# Patient Record
Sex: Male | Born: 1935 | Race: White | Hispanic: No | Marital: Married | Smoking: Current every day smoker
Health system: Southern US, Community
[De-identification: ages and names within clinical notes are randomized; demographics above are authoritative.]

## PROBLEM LIST (undated history)

## (undated) DIAGNOSIS — E7800 Pure hypercholesterolemia, unspecified: Secondary | ICD-10-CM

## (undated) DIAGNOSIS — I493 Ventricular premature depolarization: Secondary | ICD-10-CM

## (undated) DIAGNOSIS — N400 Enlarged prostate without lower urinary tract symptoms: Secondary | ICD-10-CM

## (undated) DIAGNOSIS — M545 Low back pain: Secondary | ICD-10-CM

## (undated) DIAGNOSIS — M1990 Unspecified osteoarthritis, unspecified site: Secondary | ICD-10-CM

## (undated) DIAGNOSIS — G8929 Other chronic pain: Secondary | ICD-10-CM

## (undated) DIAGNOSIS — I2119 ST elevation (STEMI) myocardial infarction involving other coronary artery of inferior wall: Secondary | ICD-10-CM

## (undated) DIAGNOSIS — S86819A Strain of other muscle(s) and tendon(s) at lower leg level, unspecified leg, initial encounter: Secondary | ICD-10-CM

## (undated) DIAGNOSIS — I10 Essential (primary) hypertension: Secondary | ICD-10-CM

## (undated) DIAGNOSIS — Z720 Tobacco use: Secondary | ICD-10-CM

## (undated) DIAGNOSIS — L02215 Cutaneous abscess of perineum: Secondary | ICD-10-CM

## (undated) DIAGNOSIS — N529 Male erectile dysfunction, unspecified: Secondary | ICD-10-CM

## (undated) DIAGNOSIS — E119 Type 2 diabetes mellitus without complications: Secondary | ICD-10-CM

## (undated) HISTORY — DX: Cutaneous abscess of perineum: L02.215

## (undated) HISTORY — DX: Benign prostatic hyperplasia without lower urinary tract symptoms: N40.0

## (undated) HISTORY — DX: Essential (primary) hypertension: I10

## (undated) HISTORY — DX: Unspecified osteoarthritis, unspecified site: M19.90

## (undated) HISTORY — DX: ST elevation (STEMI) myocardial infarction involving other coronary artery of inferior wall: I21.19

## (undated) HISTORY — DX: Tobacco use: Z72.0

## (undated) HISTORY — DX: Male erectile dysfunction, unspecified: N52.9

## (undated) HISTORY — DX: Ventricular premature depolarization: I49.3

## (undated) HISTORY — DX: Low back pain: M54.5

## (undated) HISTORY — DX: Strain of other muscle(s) and tendon(s) at lower leg level, unspecified leg, initial encounter: S86.819A

## (undated) HISTORY — PX: JOINT REPLACEMENT: SHX530

## (undated) HISTORY — DX: Pure hypercholesterolemia, unspecified: E78.00

## (undated) HISTORY — DX: Type 2 diabetes mellitus without complications: E11.9

## (undated) HISTORY — DX: Other chronic pain: G89.29

---

## 2005-05-15 ENCOUNTER — Other Ambulatory Visit: Payer: Self-pay

## 2005-05-15 ENCOUNTER — Ambulatory Visit: Payer: Self-pay | Admitting: Otolaryngology

## 2005-05-21 ENCOUNTER — Ambulatory Visit: Payer: Self-pay | Admitting: Otolaryngology

## 2007-01-22 ENCOUNTER — Encounter: Payer: Self-pay | Admitting: Orthopaedic Surgery

## 2007-02-05 ENCOUNTER — Encounter: Payer: Self-pay | Admitting: Orthopaedic Surgery

## 2007-03-08 ENCOUNTER — Encounter: Payer: Self-pay | Admitting: Orthopaedic Surgery

## 2007-08-04 ENCOUNTER — Ambulatory Visit: Payer: Self-pay | Admitting: Orthopedic Surgery

## 2007-08-19 ENCOUNTER — Ambulatory Visit: Payer: Self-pay | Admitting: Orthopedic Surgery

## 2007-08-21 ENCOUNTER — Inpatient Hospital Stay: Payer: Self-pay | Admitting: Orthopedic Surgery

## 2007-09-10 ENCOUNTER — Encounter: Payer: Self-pay | Admitting: Internal Medicine

## 2008-04-13 ENCOUNTER — Ambulatory Visit: Payer: Self-pay | Admitting: Unknown Physician Specialty

## 2009-05-24 ENCOUNTER — Ambulatory Visit: Payer: Self-pay | Admitting: Family Medicine

## 2009-07-24 ENCOUNTER — Ambulatory Visit: Payer: Self-pay | Admitting: Surgery

## 2009-07-26 ENCOUNTER — Ambulatory Visit: Payer: Self-pay | Admitting: Cardiovascular Disease

## 2009-07-31 ENCOUNTER — Ambulatory Visit: Payer: Self-pay | Admitting: Surgery

## 2010-09-24 ENCOUNTER — Encounter: Payer: Self-pay | Admitting: Orthopedic Surgery

## 2010-10-06 ENCOUNTER — Encounter: Payer: Self-pay | Admitting: Orthopedic Surgery

## 2010-11-05 ENCOUNTER — Ambulatory Visit: Payer: Self-pay | Admitting: Orthopedic Surgery

## 2010-11-05 DIAGNOSIS — I10 Essential (primary) hypertension: Secondary | ICD-10-CM

## 2010-11-15 ENCOUNTER — Inpatient Hospital Stay: Payer: Self-pay | Admitting: Orthopedic Surgery

## 2010-11-19 LAB — PATHOLOGY REPORT

## 2011-02-07 ENCOUNTER — Ambulatory Visit: Payer: Self-pay | Admitting: Orthopedic Surgery

## 2011-04-15 ENCOUNTER — Ambulatory Visit: Payer: Self-pay | Admitting: Orthopedic Surgery

## 2011-04-15 LAB — CBC
HGB: 11 g/dL — ABNORMAL LOW (ref 13.0–18.0)
MCH: 26.8 pg (ref 26.0–34.0)
MCHC: 32.6 g/dL (ref 32.0–36.0)
MCV: 82 fL (ref 80–100)
Platelet: 185 10*3/uL (ref 150–440)
RBC: 4.13 10*6/uL — ABNORMAL LOW (ref 4.40–5.90)

## 2011-04-15 LAB — BASIC METABOLIC PANEL
Anion Gap: 14 (ref 7–16)
BUN: 36 mg/dL — ABNORMAL HIGH (ref 7–18)
Chloride: 107 mmol/L (ref 98–107)
Creatinine: 1.1 mg/dL (ref 0.60–1.30)
EGFR (Non-African Amer.): >60
Osmolality: 293 (ref 275–301)
Potassium: 4 mmol/L (ref 3.5–5.1)

## 2011-04-15 LAB — APTT: Activated PTT: 40 secs — ABNORMAL HIGH (ref 23.6–35.9)

## 2011-04-15 LAB — PROTIME-INR: Prothrombin Time: 13.8 secs (ref 11.5–14.7)

## 2011-04-15 LAB — SEDIMENTATION RATE: Erythrocyte Sed Rate: 43 mm/hr — ABNORMAL HIGH (ref 0–20)

## 2011-04-15 LAB — MRSA PCR SCREENING

## 2011-04-23 ENCOUNTER — Inpatient Hospital Stay: Payer: Self-pay | Admitting: Orthopedic Surgery

## 2011-04-24 LAB — BASIC METABOLIC PANEL
BUN: 14 mg/dL (ref 7–18)
Calcium, Total: 7.6 mg/dL — ABNORMAL LOW (ref 8.5–10.1)
Chloride: 112 mmol/L — ABNORMAL HIGH (ref 98–107)
EGFR (African American): >60
EGFR (Non-African Amer.): >60
Osmolality: 280 (ref 275–301)
Potassium: 4.1 mmol/L (ref 3.5–5.1)
Sodium: 139 mmol/L (ref 136–145)

## 2011-04-24 LAB — PLATELET COUNT: Platelet: 255 10*3/uL (ref 150–440)

## 2011-04-24 LAB — HEMOGLOBIN: HGB: 8.3 g/dL — ABNORMAL LOW (ref 13.0–18.0)

## 2011-04-25 LAB — HEMOGLOBIN: HGB: 7.6 g/dL — ABNORMAL LOW (ref 13.0–18.0)

## 2011-04-26 LAB — HEMOGLOBIN: HGB: 8.3 g/dL — ABNORMAL LOW (ref 13.0–18.0)

## 2011-07-08 DIAGNOSIS — E7800 Pure hypercholesterolemia, unspecified: Secondary | ICD-10-CM

## 2011-07-08 HISTORY — DX: Pure hypercholesterolemia, unspecified: E78.00

## 2011-08-12 DIAGNOSIS — N400 Enlarged prostate without lower urinary tract symptoms: Secondary | ICD-10-CM | POA: Insufficient documentation

## 2011-08-12 DIAGNOSIS — M1990 Unspecified osteoarthritis, unspecified site: Secondary | ICD-10-CM

## 2011-08-12 HISTORY — DX: Benign prostatic hyperplasia without lower urinary tract symptoms: N40.0

## 2011-08-12 HISTORY — DX: Unspecified osteoarthritis, unspecified site: M19.90

## 2011-10-08 ENCOUNTER — Ambulatory Visit: Payer: Self-pay | Admitting: Orthopedic Surgery

## 2011-10-23 ENCOUNTER — Ambulatory Visit: Payer: Self-pay | Admitting: Orthopedic Surgery

## 2011-10-23 LAB — BASIC METABOLIC PANEL
BUN: 14 mg/dL (ref 7–18)
Calcium, Total: 8.6 mg/dL (ref 8.5–10.1)
Co2: 24 mmol/L (ref 21–32)
Creatinine: 0.86 mg/dL (ref 0.60–1.30)
Glucose: 104 mg/dL — ABNORMAL HIGH (ref 65–99)
Potassium: 4.2 mmol/L (ref 3.5–5.1)

## 2011-10-23 LAB — APTT: Activated PTT: 36 secs — ABNORMAL HIGH (ref 23.6–35.9)

## 2011-10-23 LAB — CBC
HCT: 38.4 % — ABNORMAL LOW (ref 40.0–52.0)
HGB: 12.5 g/dL — ABNORMAL LOW (ref 13.0–18.0)
MCH: 26.2 pg (ref 26.0–34.0)
MCHC: 32.6 g/dL (ref 32.0–36.0)

## 2011-10-23 LAB — SEDIMENTATION RATE: Erythrocyte Sed Rate: 3 mm/hr (ref 0–20)

## 2011-10-23 LAB — PROTIME-INR: INR: 1

## 2011-10-31 ENCOUNTER — Inpatient Hospital Stay: Payer: Self-pay | Admitting: Orthopedic Surgery

## 2011-10-31 LAB — URINALYSIS, COMPLETE
Bacteria: NONE SEEN
Bilirubin,UR: NEGATIVE
Blood: NEGATIVE
Ketone: NEGATIVE
Nitrite: NEGATIVE
Specific Gravity: 1.005 (ref 1.003–1.030)
Squamous Epithelial: NONE SEEN
WBC UR: 1 /HPF (ref 0–5)

## 2011-11-01 LAB — BASIC METABOLIC PANEL
Anion Gap: 10 (ref 7–16)
BUN: 12 mg/dL (ref 7–18)
Chloride: 112 mmol/L — ABNORMAL HIGH (ref 98–107)
Co2: 23 mmol/L (ref 21–32)
Creatinine: 0.96 mg/dL (ref 0.60–1.30)
EGFR (African American): >60
EGFR (Non-African Amer.): >60
Glucose: 136 mg/dL — ABNORMAL HIGH (ref 65–99)
Osmolality: 291 (ref 275–301)

## 2011-11-01 LAB — HEMOGLOBIN: HGB: 11 g/dL — ABNORMAL LOW (ref 13.0–18.0)

## 2011-11-01 LAB — PLATELET COUNT: Platelet: 168 10*3/uL (ref 150–440)

## 2011-11-02 LAB — HEMOGLOBIN: HGB: 10.4 g/dL — ABNORMAL LOW (ref 13.0–18.0)

## 2011-12-10 ENCOUNTER — Encounter: Payer: Self-pay | Admitting: Sports Medicine

## 2011-12-10 ENCOUNTER — Ambulatory Visit (INDEPENDENT_AMBULATORY_CARE_PROVIDER_SITE_OTHER): Payer: PRIVATE HEALTH INSURANCE | Admitting: Sports Medicine

## 2011-12-10 VITALS — BP 132/75 | HR 82 | Ht 67.0 in | Wt 145.0 lb

## 2011-12-10 DIAGNOSIS — M25461 Effusion, right knee: Secondary | ICD-10-CM | POA: Insufficient documentation

## 2011-12-10 DIAGNOSIS — S86819A Strain of other muscle(s) and tendon(s) at lower leg level, unspecified leg, initial encounter: Secondary | ICD-10-CM

## 2011-12-10 DIAGNOSIS — M25469 Effusion, unspecified knee: Secondary | ICD-10-CM

## 2011-12-10 DIAGNOSIS — S838X9A Sprain of other specified parts of unspecified knee, initial encounter: Secondary | ICD-10-CM

## 2011-12-10 HISTORY — DX: Strain of other muscle(s) and tendon(s) at lower leg level, unspecified leg, initial encounter: S86.819A

## 2011-12-10 NOTE — Progress Notes (Signed)
Subjective:     Patient ID: Paul Hamilton, male   DOB: 12-08-1933, 78 y.o.   MRN: [id number]  CC: R knee swelling and numbness following fall PCP: Devona Konig, PA-C/ orthopedist Dr Menz/  Referred courtesy  HPI 78 yo M with history of R knee replacement in 2010, R hip replacement in march of 2013 with recent revision in October in 2013 presents with R knee weakness resulting in a fall while walking to the bathroom at home. The fall occurred about 2 weeks post-op. He denies associated dizziness, chest pain, SOB. He reports that his R knee has been weak since the revised R hip replacement. After the fall he noticed superior-medial knee swelling. He denies significant pain. He admits to numbness over the site. He admits to decreased knee extension and no strength unless standing with knee straight  Now cannot voluntarily extend Rt knee  Review of Systems As per HPI     Objective:   Physical Exam BP 132/75  Pulse 82  Ht 5\' 7"  (1.702 m)  Wt 145 lb (65.772 kg)  BMI 22.71 kg/m2 General appearance: alert, cooperative, appears stated age and no distress Head: Normocephalic, without obvious abnormality, atraumatic Lungs: normal work of breathing   L Knee: Normal to inspection with no erythema or effusion or obvious bony abnormalities. Palpation normal with no warmth or joint line tenderness or patellar tenderness or condyle tenderness. ROM normal in flexion and extension and lower leg rotation. Ligaments with solid consistent endpoints including ACL, PCL, LCL, MCL. Non painful patellar compression. Patellar and quadriceps tendons unremarkable. Hamstring and quadriceps strength is normal.  R Knee:  superior medial knee swelling with no erythema. No obvious bony abnormalities. Healed longitudinal scar.  Palpation with no warmth, mild medial  joint line tenderness. No patellar tenderness or condyle tenderness. There is a gap just above patella Patellar tendon is softer with poor  tension There is motion of TKR on the femoral component No abnormal motion of TKR on tibial sides   Ultrasound: L knee: intact quadriceps tendon and patellar tendon No effusion  R knee: intact quadriceps tendon with hypoechoic change along edges/ underlying effusion suprapatellar pouch is milc  superiorly displaced metallic fragment vs bone shaft of distal femur Unsure if shift in TKR components   incomplete tear in patellar tendon with associated hypoechoic change and thickening compared to Lt    Assessment and Plan:

## 2011-12-10 NOTE — Assessment & Plan Note (Signed)
This is difficult to assess Unclear whether he can functionally tighten this tendon May need exploration pending status of orthopedic evaluation  Send reports to Dr Rosita Kea and discuss with him
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
## 2011-12-17 ENCOUNTER — Ambulatory Visit: Payer: Self-pay | Admitting: Orthopedic Surgery

## 2011-12-30 ENCOUNTER — Ambulatory Visit: Payer: Self-pay | Admitting: Orthopedic Surgery

## 2012-05-25 ENCOUNTER — Ambulatory Visit: Payer: Self-pay | Admitting: Family Medicine

## 2012-05-31 ENCOUNTER — Observation Stay: Payer: Self-pay

## 2012-05-31 LAB — COMPREHENSIVE METABOLIC PANEL
Albumin: 3.7 g/dL (ref 3.4–5.0)
Alkaline Phosphatase: 82 U/L (ref 50–136)
Anion Gap: 5 — ABNORMAL LOW (ref 7–16)
BUN: 22 mg/dL — ABNORMAL HIGH (ref 7–18)
Bilirubin,Total: 0.5 mg/dL (ref 0.2–1.0)
Co2: 27 mmol/L (ref 21–32)
Creatinine: 1.03 mg/dL (ref 0.60–1.30)
EGFR (African American): >60
EGFR (Non-African Amer.): >60
Glucose: 108 mg/dL — ABNORMAL HIGH (ref 65–99)
SGOT(AST): 16 U/L (ref 15–37)
SGPT (ALT): 17 U/L (ref 12–78)
Total Protein: 7.2 g/dL (ref 6.4–8.2)

## 2012-05-31 LAB — CK TOTAL AND CKMB (NOT AT ARMC): CK-MB: 3.1 ng/mL (ref 0.5–3.6)

## 2012-05-31 LAB — CBC
HCT: 41.4 % (ref 40.0–52.0)
HGB: 13.3 g/dL (ref 13.0–18.0)
MCH: 26 pg (ref 26.0–34.0)
MCHC: 32.2 g/dL (ref 32.0–36.0)
MCV: 81 fL (ref 80–100)
Platelet: 196 10*3/uL (ref 150–440)
RDW: 16.6 % — ABNORMAL HIGH (ref 11.5–14.5)
WBC: 14.4 10*3/uL — ABNORMAL HIGH (ref 3.8–10.6)

## 2012-05-31 LAB — TROPONIN I: Troponin-I: <0.02 ng/mL

## 2012-06-01 LAB — BASIC METABOLIC PANEL
BUN: 24 mg/dL — ABNORMAL HIGH (ref 7–18)
Chloride: 107 mmol/L (ref 98–107)
Co2: 27 mmol/L (ref 21–32)
Creatinine: 1.08 mg/dL (ref 0.60–1.30)
Glucose: 138 mg/dL — ABNORMAL HIGH (ref 65–99)
Osmolality: 286 (ref 275–301)
Potassium: 4.7 mmol/L (ref 3.5–5.1)

## 2012-06-01 LAB — CBC WITH DIFFERENTIAL/PLATELET
Basophil %: 0.8 %
HCT: 36.2 % — ABNORMAL LOW (ref 40.0–52.0)
MCH: 26.6 pg (ref 26.0–34.0)
MCHC: 33.2 g/dL (ref 32.0–36.0)
Monocyte #: 2.2 x10 3/mm — ABNORMAL HIGH (ref 0.2–1.0)
Neutrophil #: 10.5 10*3/uL — ABNORMAL HIGH (ref 1.4–6.5)
Neutrophil %: 66.7 %
WBC: 15.8 10*3/uL — ABNORMAL HIGH (ref 3.8–10.6)

## 2012-06-01 LAB — TROPONIN I: Troponin-I: <0.02 ng/mL

## 2012-06-08 ENCOUNTER — Ambulatory Visit: Payer: Self-pay

## 2012-07-20 ENCOUNTER — Ambulatory Visit: Payer: Self-pay

## 2012-10-08 DIAGNOSIS — M25559 Pain in unspecified hip: Secondary | ICD-10-CM | POA: Insufficient documentation

## 2012-10-08 DIAGNOSIS — Z96649 Presence of unspecified artificial hip joint: Secondary | ICD-10-CM | POA: Insufficient documentation

## 2012-10-19 ENCOUNTER — Ambulatory Visit: Payer: Self-pay | Admitting: Orthopedic Surgery

## 2013-03-26 DIAGNOSIS — Z720 Tobacco use: Secondary | ICD-10-CM

## 2013-03-26 DIAGNOSIS — I493 Ventricular premature depolarization: Secondary | ICD-10-CM | POA: Insufficient documentation

## 2013-03-26 HISTORY — DX: Tobacco use: Z72.0

## 2013-03-26 HISTORY — DX: Ventricular premature depolarization: I49.3

## 2013-03-29 DIAGNOSIS — R931 Abnormal findings on diagnostic imaging of heart and coronary circulation: Secondary | ICD-10-CM | POA: Insufficient documentation

## 2013-03-30 ENCOUNTER — Ambulatory Visit: Payer: Self-pay | Admitting: Family Medicine

## 2013-04-24 IMAGING — CR RIGHT HIP - COMPLETE 2+ VIEW
1 series · 2 of 2 positions shown · non-contrast
Comparison: none

REASON FOR EXAM: s/p THA
COMMENTS:   Bedside (portable):Y

[Series 1: ap · 0.17mm/px · 2 of 2 slices shown]
[im 1/2]
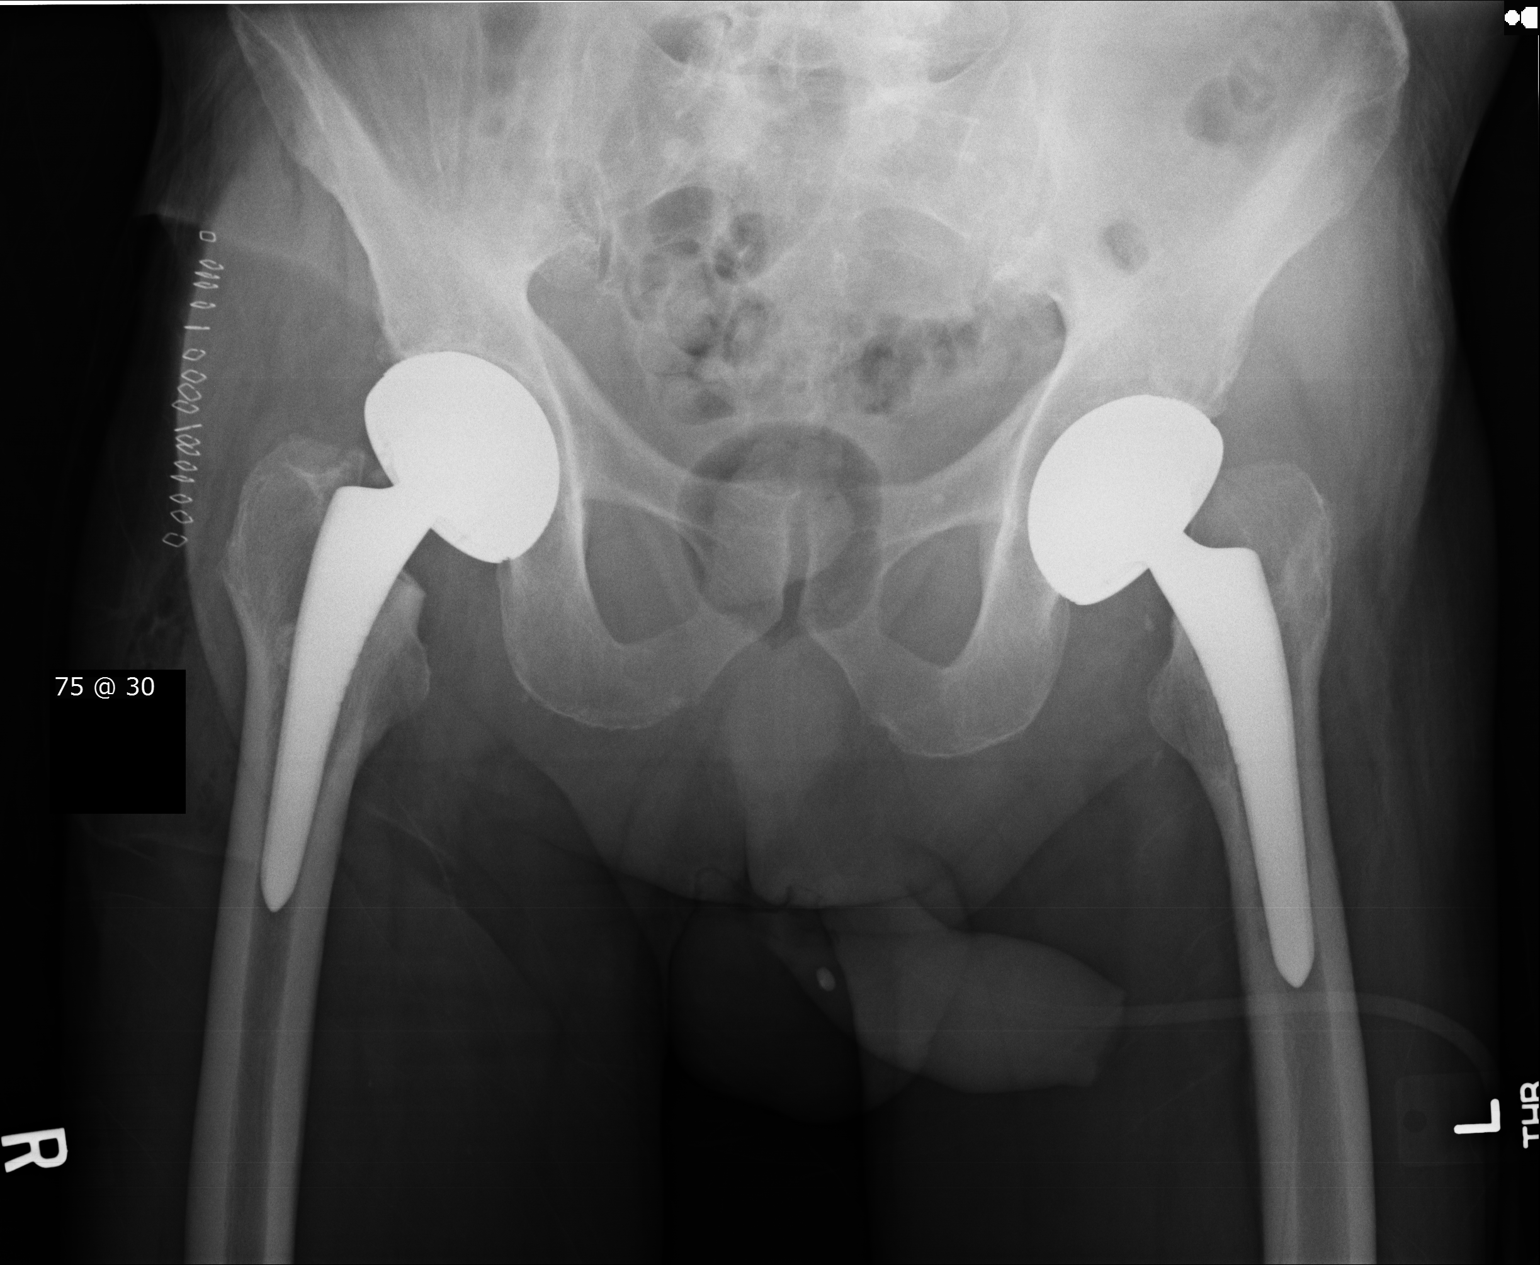
[im 2/2]
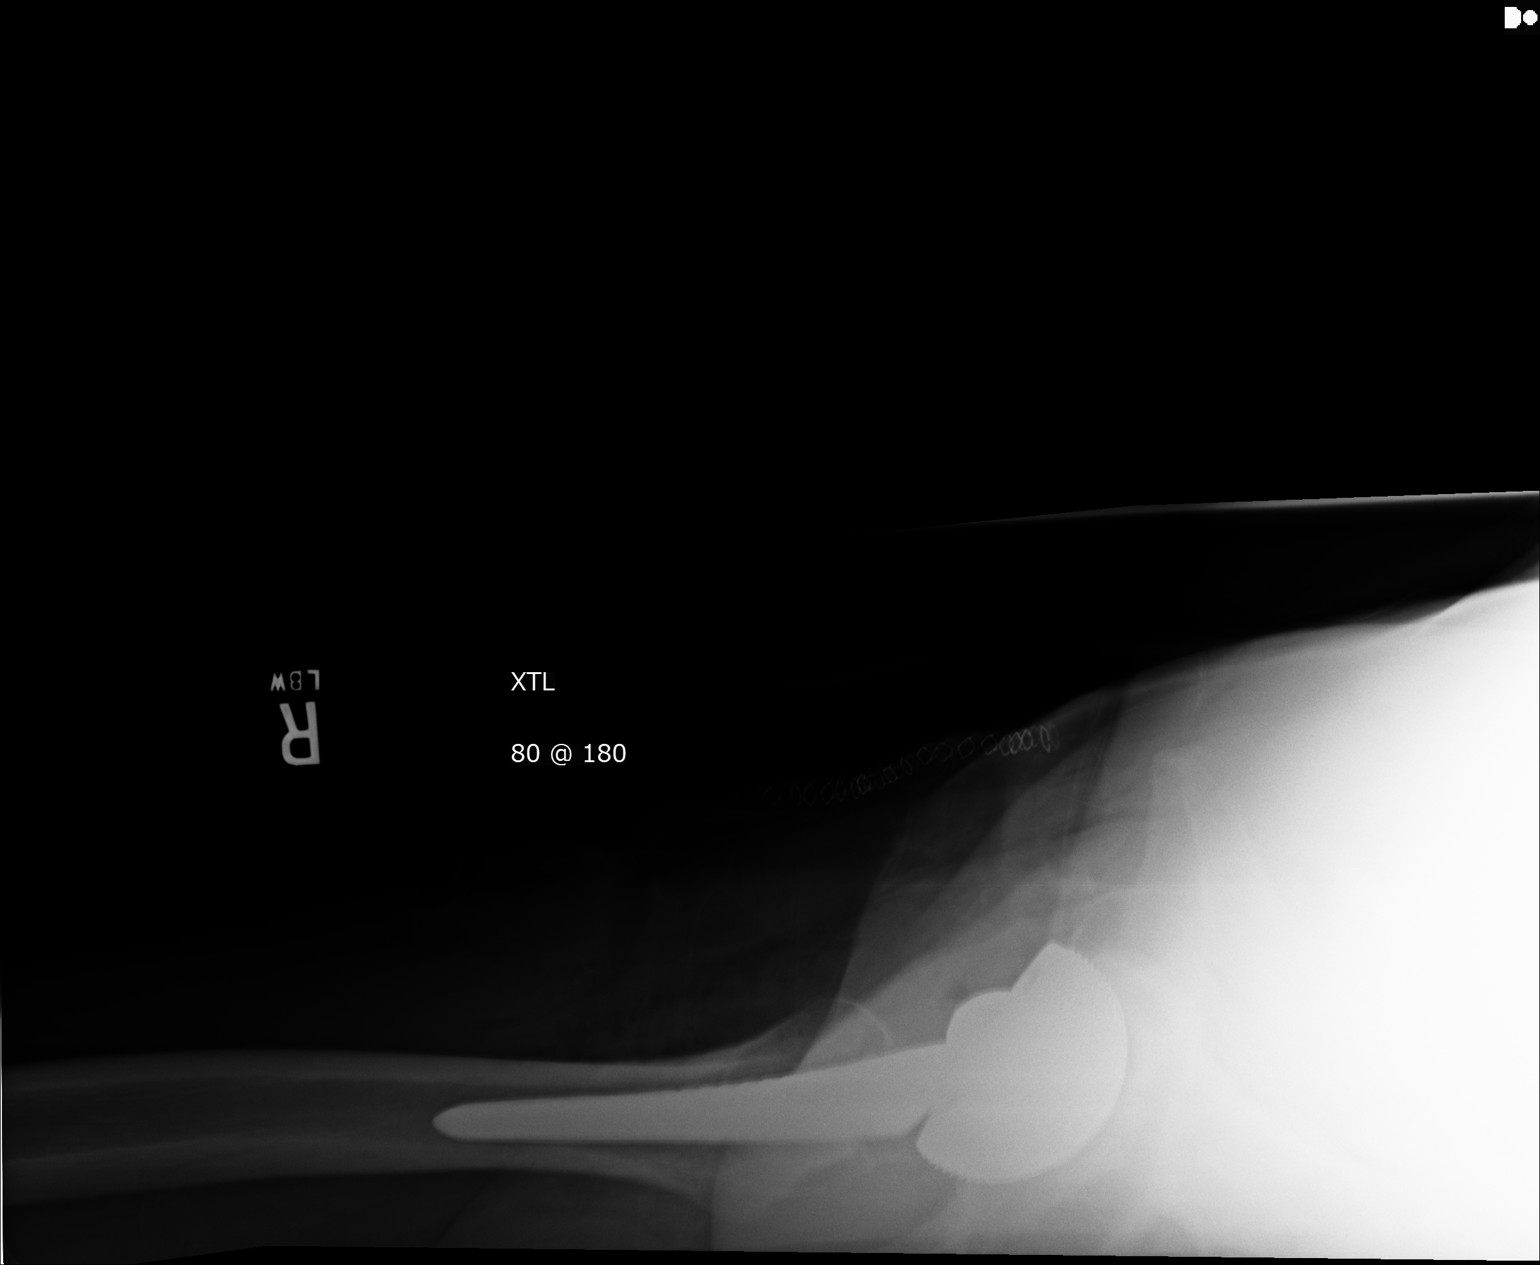

[2 of 2 positions shown; findings below may reference images not displayed]

PROCEDURE:     DXR - DXR HIP RIGHT COMPLETE  - April 23, 2011 [DATE]

RESULT:     AP view of the lower pelvis shows patient status post right hip
arthroplasty. Skin staples are present. No acute bony abnormality is
evident. Crosstable lateral view demonstrates the prosthetic femoral head
located in the acetabulum prosthesis uncovered anteriorly.
IMPRESSION: Postop right hip arthroplasty.

## 2013-04-30 DIAGNOSIS — I2119 ST elevation (STEMI) myocardial infarction involving other coronary artery of inferior wall: Secondary | ICD-10-CM | POA: Insufficient documentation

## 2013-04-30 HISTORY — DX: ST elevation (STEMI) myocardial infarction involving other coronary artery of inferior wall: I21.19

## 2013-06-07 ENCOUNTER — Ambulatory Visit: Payer: Self-pay | Admitting: Hematology and Oncology

## 2013-06-07 LAB — CBC CANCER CENTER
BASOS ABS: 0.1 x10 3/mm (ref 0.0–0.1)
BASOS PCT: 0.5 %
EOS ABS: 0.1 x10 3/mm (ref 0.0–0.7)
EOS PCT: 0.5 %
HCT: 40.7 % (ref 40.0–52.0)
HGB: 12.9 g/dL — ABNORMAL LOW (ref 13.0–18.0)
LYMPHS ABS: 3.1 x10 3/mm (ref 1.0–3.6)
Lymphocyte %: 24.8 %
MCH: 25.8 pg — ABNORMAL LOW (ref 26.0–34.0)
MCHC: 31.6 g/dL — AB (ref 32.0–36.0)
MCV: 82 fL (ref 80–100)
MONO ABS: 1.5 x10 3/mm — AB (ref 0.2–1.0)
Monocyte %: 11.9 %
Neutrophil #: 7.7 x10 3/mm — ABNORMAL HIGH (ref 1.4–6.5)
Neutrophil %: 62.3 %
Platelet: 167 x10 3/mm (ref 150–440)
RBC: 5 10*6/uL (ref 4.40–5.90)
RDW: 16.8 % — ABNORMAL HIGH (ref 11.5–14.5)
WBC: 12.3 x10 3/mm — ABNORMAL HIGH (ref 3.8–10.6)

## 2013-07-05 ENCOUNTER — Ambulatory Visit: Payer: Self-pay | Admitting: Hematology and Oncology

## 2013-08-30 DIAGNOSIS — N529 Male erectile dysfunction, unspecified: Secondary | ICD-10-CM

## 2013-08-30 HISTORY — DX: Male erectile dysfunction, unspecified: N52.9

## 2013-11-01 IMAGING — CR RIGHT HIP - COMPLETE 2+ VIEW
1 series · 2 of 2 positions shown · non-contrast
Comparison: none

REASON FOR EXAM: s/p THA
COMMENTS:   Bedside (portable):Y

[Series 1: ap · 0.17mm/px · 2 of 2 slices shown]
[im 1/2]
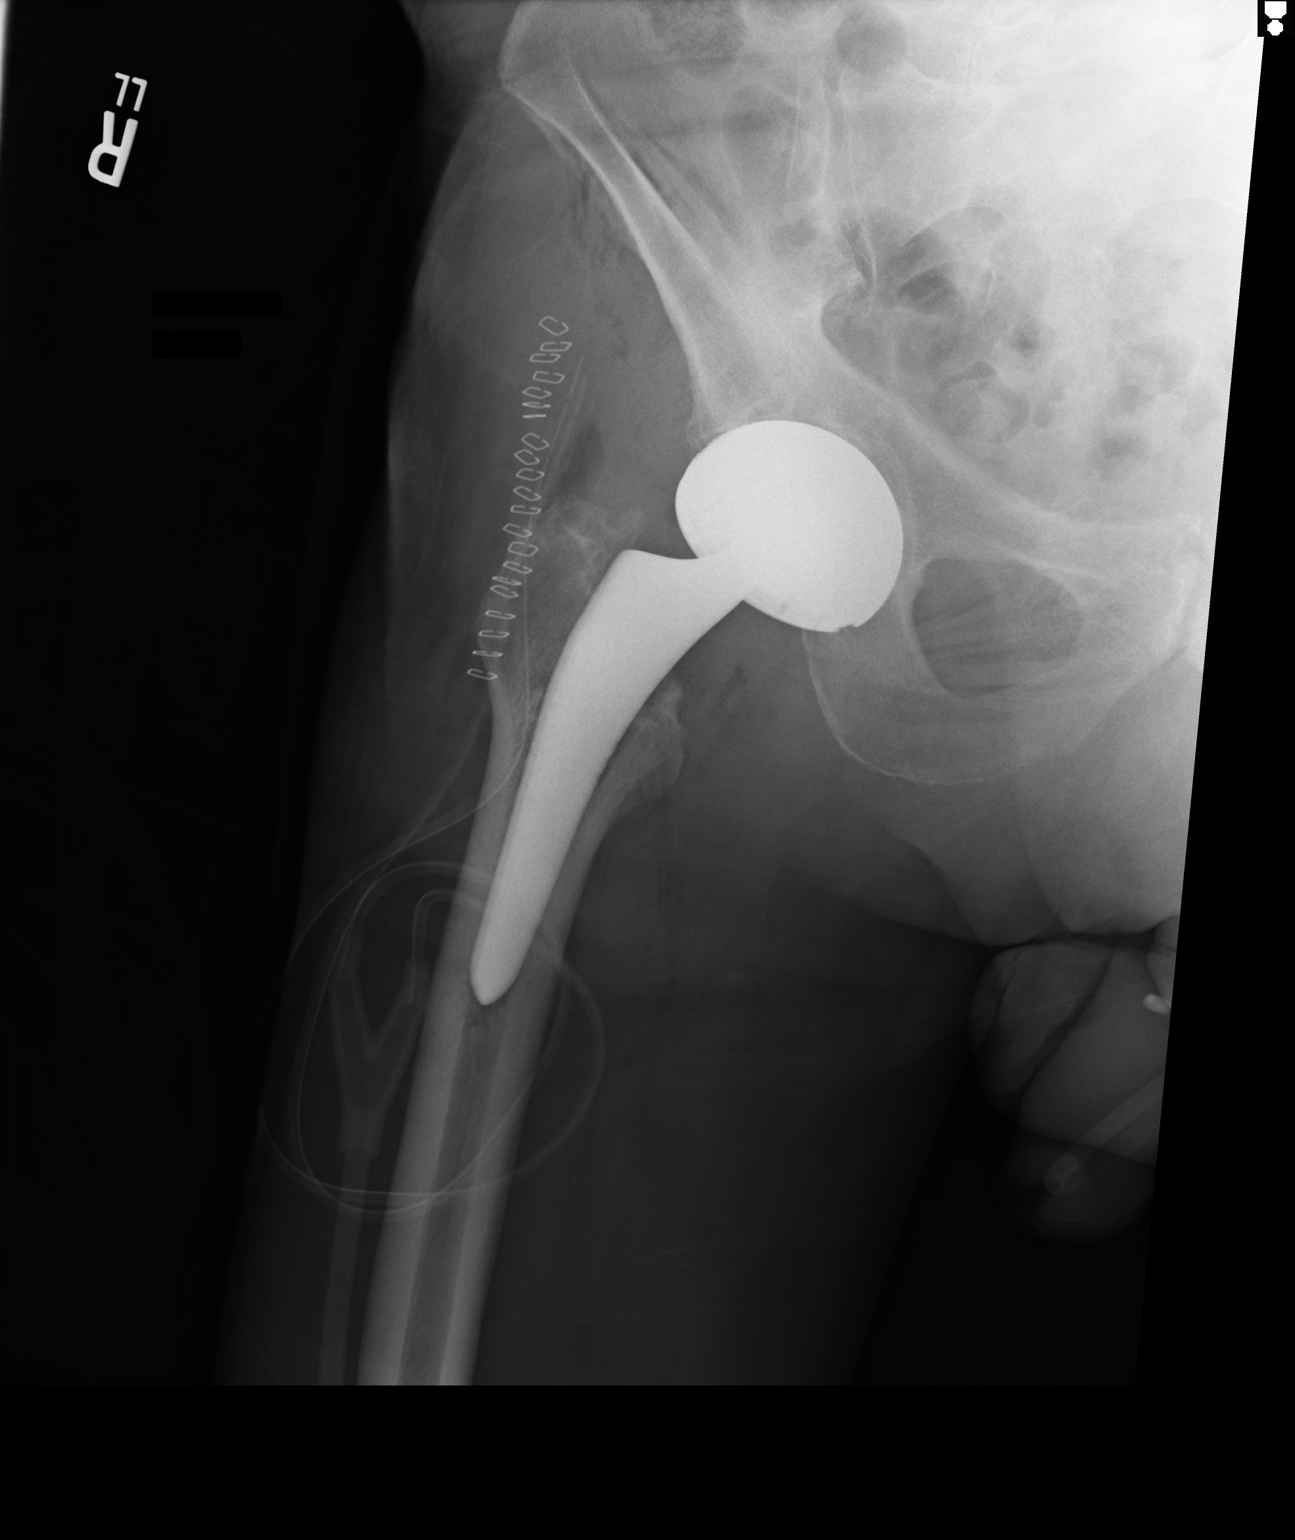
[im 2/2]
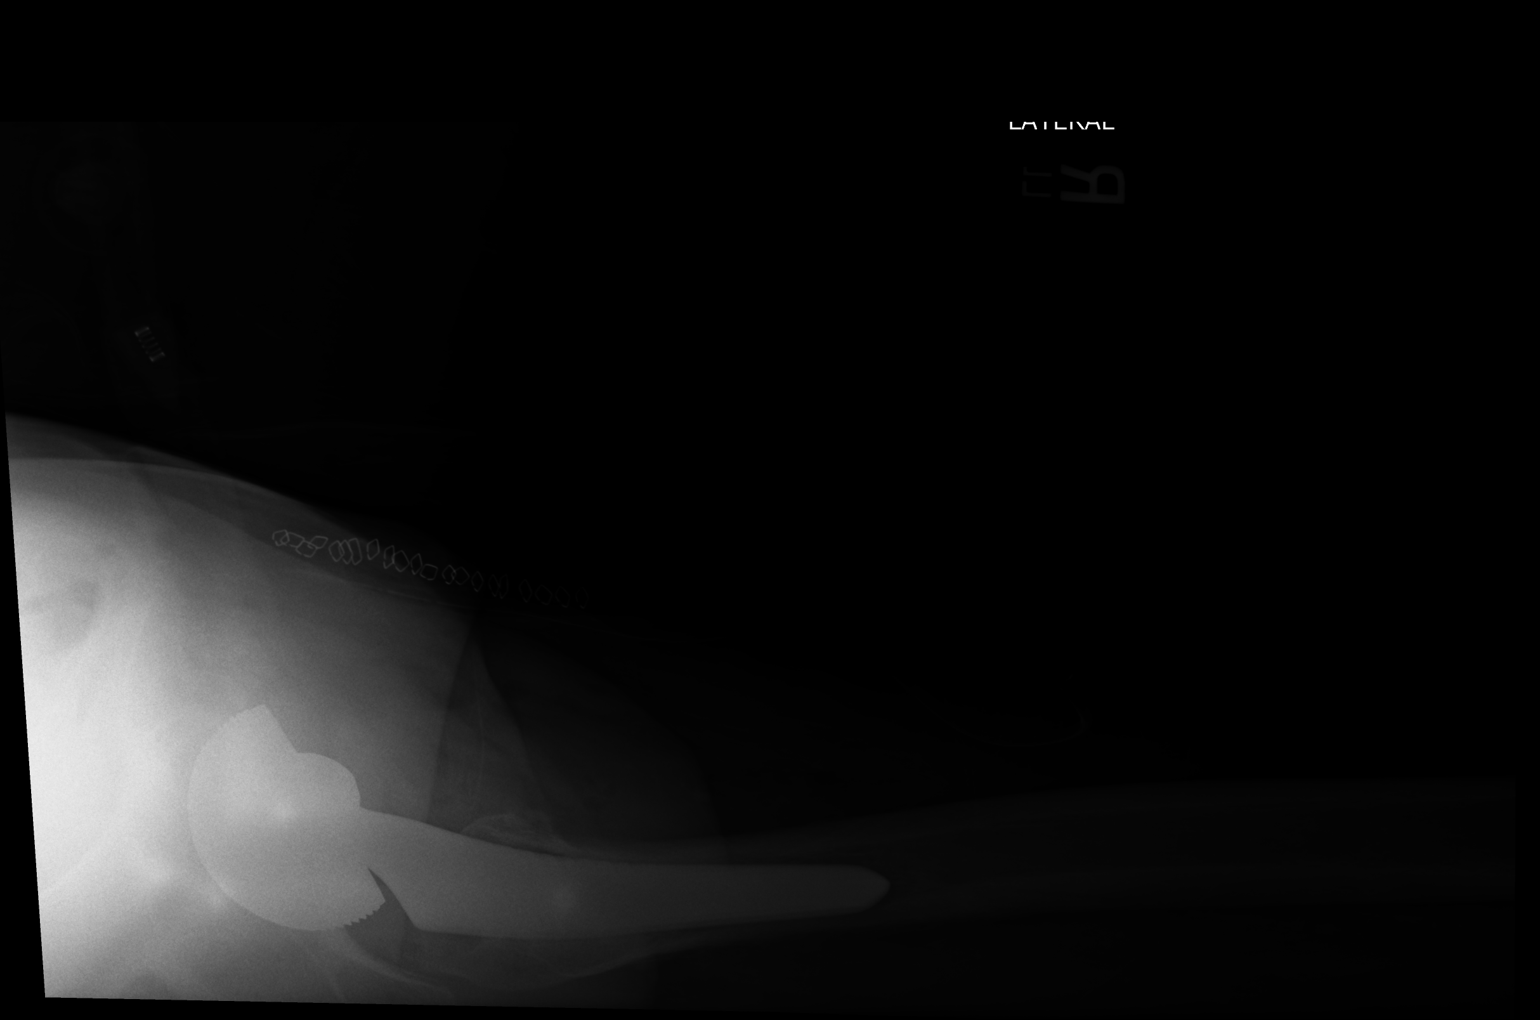

[2 of 2 positions shown; findings below may reference images not displayed]

PROCEDURE:     DXR - DXR HIP RIGHT COMPLETE  - October 31, 2011 [DATE]

RESULT:     AP and lateral portable views of the right hip reveal the
patient to have undergone total joint prosthesis placement. Radiographic
positioning of the prosthetic components is good. Surgical drain lines and
skin staples are present.
IMPRESSION: The patient has undergone right lower right total hip joint
prosthesis placement. Further interpretation is deferred to Dr. Databex.

[REDACTED]

## 2014-05-24 NOTE — Discharge Summary (Signed)
PATIENT NAME:  Paul Hamilton, Paul Hamilton MR#:  [id number] DATE OF BIRTH:  1933/04/27  DATE OF ADMISSION:  10/31/2011 DATE OF DISCHARGE:  11/03/2011  ADMITTING DIAGNOSIS: Loose right femoral component, total hip.  POSTOPERATIVE DIAGNOSIS: Loose right femoral component, total hip.   PROCEDURE: Revision of femoral component, right total hip.   ANESTHESIA: Spinal.   SURGEON: [name], MD   ASSISTANT: Cranston Neighbor, PA-C, April Berndt, NP    ESTIMATED BLOOD LOSS: 100 mL. Cell Saver was present but not required.   COMPLICATIONS: None.   CONDITION: To recovery room stable.   HISTORY: The patient is a 81 year old who initially had successful right hip total replacement. Over the past several months he has had increasing pain in the thigh, pain that was not present previously. 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 He comes in today to discuss treatment options.   PHYSICAL EXAMINATION: LUNGS: Clear to auscultation. HEART: Regular rate and rhythm. HEENT: He has partial upper and lower dentures. NECK: Neck range of motion is good. EXTREMITIES: He has pain with internal and external rotation in the mid thigh. He has a well healed scar anterior scar. Distally he is neurovascularly intact and sensation intact.  I reviewed bone scan with him.   HOSPITAL COURSE: The patient was admitted to the hospital on 10/31/2011. He had surgery that same day and was brought to the orthopedic floor from the PAC-U in stable condition. The patient's vital signs and lab work remained stable and he progressed well with physical therapy and on 11/03/2011 the patient was stable and ready for discharge to home with home PT.   DISCHARGE INSTRUCTIONS:  1. The patient may gradually increase weightbearing on the affected extremity.  2. He may resume a regular diet as tolerated.   MEDICATIONS:   1. Aspirin 81 mg once a day. 2. Ambien 10 mg 1 tablet at night as needed for sleep. 3. Resume typical home pain medications. 4. Tylenol 650 to 1000 mg every six hours as needed for pain. 5. Oxycodone 5 to 10 mg every four hours as needed for pain.   WOUND CARE:  1. Do not get the dressing or bandage wet or dirty.  2. Call [facility] if the dressing gets water under it.  3. Leave the dressing.  4. Remove staples and apply benzoin and Steri-Strips in two weeks.   SYMPTOMS TO REPORT: Call [facility] if any of the following occur:  1. Bright red bleeding from the incision wound, fever above 101.5 degrees, redness, swelling, or drainage at the incision.  2. Call [facility] if you experience increased leg pain, numbness, or weakness in your legs, or bowel or bladder symptoms.   REFERRALS: Home physical therapy has been arranged for continuation of rehab program. Please call [facility] if a therapist has not contacted you within 48 hours of your return home. Call [facility] for follow-up.   DISCHARGE MEDICATIONS:  1. Metformin 500 mg oral tablet 1 tablet orally once a day in the morning.  2. Pravastatin 40 mg oral tablet 1 tablet once a day in the morning.  3. Ibuprofen 200 mg oral tablet 4 tabs orally once a day in the morning and then every six hours as needed.    4. Losartan 100 mg oral tablet 1 tablet orally once a day in the morning.  ____________________________ Evon Slackhomas C. Hazleigh Mccleave, PA-C tcg:drc D: 11/18/2011 17:03:42 ET T: 11/19/2011 08:28:20 ET JOB#: [id number]  cc: Evon Slackhomas C. Kinneth Fujiwara, PA-C, <Dictator> Evon SlackHOMAS C Maythe Deramo GeorgiaPA ELECTRONICALLY SIGNED 11/19/2011 12:39

## 2014-05-24 NOTE — Op Note (Signed)
PATIENT NAME:  Paul Hamilton, Paul Hamilton MR#:  [id number] DATE OF BIRTH:  December 18, 1933  DATE OF PROCEDURE:  10/31/2011  PREOPERATIVE DIAGNOSIS: Loose right femoral component total hip.   POSTOPERATIVE DIAGNOSIS: Loose right femoral component total hip.   PROCEDURE: Revision femoral component right total hip.  ANESTHESIA: Spinal.   SURGEON: Leitha SchullerMichael J. Rhyann Berton, MD  ASSISTANTS: Cranston Neighborhris Gaines, PA-C, April Berndt, nurse practitioner.   DESCRIPTION OF PROCEDURE: Patient brought to the Operating Room and after adequate anesthesia was obtained patient was placed on the operative table with the left leg in the well legholder, right leg in the Medacta table attachment boot. C-arm was brought in and good visualization could be obtained. The hip was then prepped and draped using the standard method and after appropriate patient identification and timeout procedures were completed, the prior anterior incision was made over the tensor fascia lata muscle. The tensor was then retracted laterally and the rectus was identified and retracted medially. The anterior capsule is then incised and a culture obtained of the joint fluid. It was clear in appearance. No evidence of infection. After opening up the capsule adequately the hip could be dislocated. The bipolar head was removed. On inspection of the stem it was significantly loose. After clearing scar around the edges of the implant the implant was removed without difficulty. Next, sequential broaching carried out. It had been a #1 stem. Starting with a 0 and then going up to try to get rid of scar tissue around the medullary canal sequential broaching carried out. With the #2 there still appeared to be looseness to the stem and so a #3 broach was subsequently inserted and had a very nice fill on x-ray and clinically with no rotatory deformity. The #3 stem was inserted after trialing with different head and neck components and a medium head with a 56 mm dual mobility liner was  assembled. After insertion of the stem to the appropriate level the bipolar head was impacted and the hip was reduced. It appeared very stable and leg lengths appeared to be appropriate. The wound was thoroughly irrigated and then closed with Ethibond anteriorly on the capsule, running heavy quill for the deep fascia with a combination of 30 mL of 0.25% Sensorcaine with epinephrine and 10 mg of morphine along with saline injected in the periarticular tissue. A subcutaneous Hemovac drain was inserted. Subcutaneous tissues closing using 2-0 quill followed by skin staples. Xeroform, 4 x 4's, ABD, and tape applied.   SPECIMEN: Removed femoral components. Picture taken of these and culture.   ESTIMATED BLOOD LOSS: 100 mL. Cell Saver was present, but not required.   COMPLICATIONS: None.   CONDITION: To recovery room stable.   ____________________________ Leitha SchullerMichael J. Aldous Housel, MD mjm:cms D: 10/31/2011 14:43:43 ET T: 10/31/2011 16:56:40 ET JOB#: [id number]  cc: Leitha SchullerMichael J. Audryana Hockenberry, MD, <Dictator>  Leitha SchullerMICHAEL J Timira Bieda MD ELECTRONICALLY SIGNED 10/31/2011 18:02

## 2014-05-24 NOTE — Discharge Summary (Signed)
PATIENT NAME:  Paul Hamilton, Jacobi E MR#:  [id number] DATE OF BIRTH:  05/01/1933  DATE OF ADMISSION:  12/30/2011 DATE OF DISCHARGE:  12/31/2011  ADMITTING DIAGNOSIS: Right quadriceps tendon tear.   DISCHARGE DIAGNOSIS: Right quadriceps tendon tear.   PROCEDURE: Repair of quad tendon.   SURGEON: Kennedy BuckerMichael Menz, MD   ANESTHESIA: Spinal.   ESTIMATED BLOOD LOSS: Minimal.   TOURNIQUET TIME: 20 minutes at 300 mmHg.  OPERATIVE FINDINGS: Central portion degenerated and torn.   HISTORY: The patient is a 81 year old who had a total hip revision on 10/31/2011. Subsequently, he developed a lot of weakness in the thigh. 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 Presumably this is related to his surgery. However, he also had an ultrasound because he seems to have a palpable defect in the quadriceps. [facility] by Dr. Darrick PennaFields. This showed extensive tendinitis within the patellar tendon with a defect anteriorly and with significant degenerative change to the quadriceps.   PHYSICAL EXAMINATION: LUNGS: Clear. HEART: Regular rate and rhythm. HEENT: The patient wears glasses. On exam he has palpable defect of the quadriceps. On exam he has weakness to quad extension. He has palpable defect and tenderness over the patellar tendon.   HOSPITAL COURSE: The patient was admitted to the hospital on 12/30/2011. He had surgery that same day and was brought to the orthopedic floor from the PAC-U in stable condition. The patient was monitored overnight and vital signs remained stable. On 12/31/2011, the patient was stable and ready for discharge home with home physical therapy.  DISCHARGE INSTRUCTIONS: The patient may gradually increase weight-bearing on the affected extremity. He needs to wear an immobilizer on the right knee at all times. He may resume a regular diet as tolerated. Medications consist of aspirin 325 mg once  a day and resume typical home medications. Pain medications will consist of Vicodin 1 to 2 tablets every 4 to 6 hours as needed for pain. Wound care - he is to apply ice to the affected area, do not get the dressing or bandage wet or dirty. Call [facility] if the dressing gets water under it. Leave the dressing on. Symptoms to report - call [facility] if any of the following occur: Bright red bleeding from the incision wound, fever above 101.5 degrees, redness, swelling, or drainage at the incision. Call [facility] if you experience any increased leg pain, numbness or weakness in your legs, bowel or bladder symptoms. He is referred home with home physical therapy. He needs to [facility] if a therapist has not contacted him within 48 hours of his return home. He [facility] in two weeks. He needs to call for this appointment.   DISCHARGE MEDICATIONS:  1. Vicodin 5/325 mg 1 to 2 orally every 4 to 6 hours as needed for pain.  2. Metformin 500 mg oral tablet 1 tablet orally once a day in the morning.  3. Pravastatin 40 mg oral tablet 1 tablet orally once a day in the morning. 4. Losartan 100 mg oral tablet 1 tablet orally once a day in the morning.  ____________________________ Evon Slackhomas C. Grantham Hippert, PA-C tcg:slb D: 12/31/2011 08:20:44 ET T: 12/31/2011 13:46:17 ET JOB#: [id number]  cc: Evon Slackhomas C. Wilfrido Luedke, PA-C, <Dictator> Evon SlackHOMAS C Tyera Hansley GeorgiaPA ELECTRONICALLY SIGNED 01/01/2012 10:07

## 2014-05-24 NOTE — Op Note (Signed)
PATIENT NAME:  Paul Hamilton, Paul Hamilton MR#:  [id number] DATE OF BIRTH:  06-30-1933  DATE OF PROCEDURE:  12/30/2011  PREOPERATIVE DIAGNOSIS: Right quadriceps tear.   POSTOPERATIVE DIAGNOSIS: Right quadriceps tear.   PROCEDURE: Repair quadriceps tear, right leg.   ANESTHESIA: Spinal.   SURGEON: Leitha SchullerMichael J. Awilda Covin, MD  DESCRIPTION OF PROCEDURE: Patient was brought to the Operating Room and after adequate spinal anesthesia was obtained, the right leg was prepped and draped in the usual sterile fashion with a tourniquet applied to the upper thigh. After patient identification and timeout procedures were completed, the prior anterior knee incision was opened and the subcutaneous tissue spread. Inspection of the patellar tendon showed it appeared to be normal. Proximal in the central portion of the quadriceps there is approximately 3 cm area of degredation of the tendon with tear that was nearly complete. This tendinopathy area was debrided with scalpel to get fresh edges. A 5 Ethibond was then woven from superior to the tear of the quadriceps around the patella and then woven through this tear and repair of the tendon. This was then oversewn with an 0 chromic to get additional inflammatory reaction and to get healing. The knee was placed through a range of motion and the repair held at 90 degrees of flexion. The knee was then irrigated and closed with 2-0 quill suture followed by skin staples. Xeroform, 4 x 4's, ABD, Webril, and Ace wrap followed by a knee immobilizer. Patient was sent to recovery in stable condition.   ESTIMATED BLOOD LOSS: Minimal.   COMPLICATIONS: None.   SPECIMEN: None.      TOURNIQUET TIME: 20 minutes at 300 mmHg.  ____________________________ Leitha SchullerMichael J. Waunita Sandstrom, MD mjm:cms D: 12/30/2011 20:01:00 ET T: 12/31/2011 10:07:06 ET JOB#: [id number]  cc: Leitha SchullerMichael J. Tiburcio Linder, MD, <Dictator> Leitha SchullerMICHAEL J Cedra Villalon MD ELECTRONICALLY SIGNED 01/02/2012 10:53
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
## 2014-05-29 NOTE — Op Note (Signed)
PATIENT NAME:  Paul Hamilton, Paul Hamilton MR#:  [id number] DATE OF BIRTH:  1933-05-23  DATE OF PROCEDURE:  04/23/2011  PREOPERATIVE DIAGNOSIS: Right hip osteoarthritis.   POSTOPERATIVE DIAGNOSIS: Right hip osteoarthritis.   PROCEDURE: Right total hip replacement, anterior approach.  SURGEON: Kennedy BuckerMichael Hally Colella, M.D.   ASSISTANT: Thompson GrayerJonathan Prentice, PA-C   ANESTHESIA: Spinal.   DESCRIPTION OF PROCEDURE: The patient was brought to the Operating Room and after adequate spinal anesthesia was obtained, the left leg was placed in a well legholder, the right leg in the traction boot for the Medacta frame attachment to the bed. The C-arm was brought in and adequate visualization was obtained with the image being taken of the hip initially. The hip was then prepped and draped in the usual sterile fashion. An anterior approach was made with the incision centered over the tensor muscle. The incision was carried down to the tensor fascia, which was then incised and the tensor retracted laterally, preserving the sartorius fascia. The rectus sheath was then entered and the rectus retracted medially. A branch of the anterior lateral circumflex vessel was coagulated and the vessel itself was tied off. Next, the anterior capsule was exposed and an anterior capsulotomy carried out. The hip was externally rotated and femoral neck cut carried out and the head removed. There was extensive erosion of the superior aspect of the head. The labrum was debrided and sequential reaming was carried out to 56 mm and a 56-mm trial fit well. A 56-mm cup was then impacted into position and was very stable. Next, the leg was dropped down into extension and sequential hand reaming carried out. With the #1 stem there was a tight fit, and with a small head and 56-mm trial the leg lengths appeared to be appropriate with intraoperative imaging. The final components were then impacted and the hip reduced. Final x-ray in the Intra-Op showed good position of  components. The hip appeared stable. The wound was thoroughly irrigated at this point and the capsule repaired using a heavy Ethibond suture, running quill for the tensor fascia, 2-0 quill subcutaneously, and skin staples. Xeroform, 4 x 4's, ABD, and tape applied. Prior to closure the capsule and muscle were infiltrated with 30 mL of Sensorcaine with epinephrine and 10 mg of morphine to aid in postoperative analgesia.   ESTIMATED BLOOD LOSS: 300 mL.  Cell Saver gave back 125.   URINE OUTPUT: 200.   IMPLANTS: Medacta Versa-Fit cup DM, 56-mm outer diameter, 28-mm inner diameter dual mobility liner with a size S 28 femoral head and a size 1 Amis standard stem.  SPECIMEN:  Femoral head.  COMPLICATIONS: None.   CONDITION: To the recovery room stable.    ____________________________ Leitha SchullerMichael J. Annastasia Haskins, MD mjm:bjt D: 04/23/2011 17:50:00 ET T: 04/24/2011 09:29:38 ET JOB#: [id number]  cc: Leitha SchullerMichael J. Haydan Wedig, MD, <Dictator> Leitha SchullerMICHAEL J Trista Ciocca MD ELECTRONICALLY SIGNED 04/24/2011 17:12

## 2014-05-29 NOTE — Discharge Summary (Signed)
PATIENT NAME:  Paul Hamilton, Paul Hamilton MR#:  [id number] DATE OF BIRTH:  Dec 15, 1933  DATE OF ADMISSION:  04/23/2011 DATE OF DISCHARGE:  04/26/2011  ADMITTING DIAGNOSIS: Status post right total hip replacement for degenerative arthritis.   DISCHARGE DIAGNOSES:  1. Status post right total hip replacement for degenerative arthritis.  2. Acute postoperative blood loss anemia.  Better after one unit packed red blood cell transfusion.  ATTENDING: Kennedy Bucker, MD  PROCEDURES: On 03/19 the patient underwent right total hip arthroplasty by Dr. Rosita Kea with assistant Thompson Grayer, PA-C.   ANESTHESIA: Spinal.   ESTIMATED BLOOD LOSS: 400 mL with 125 mL reinfused through Cell Saver.   OPERATIVE FINDINGS: Severe degenerative joint disease.   SPECIMEN: Femoral head specimen was sent.   IMPLANTS: Medacta, AMIS system.   DRAINS: No drains were placed.   COMPLICATIONS: No complications occurred.   HISTORY: Paul Hamilton is a pleasant 81 year old who has had a prior successful left total hip replacement. He had some back problems and had EMG/nerve conduction study that showed some peripheral neuropathy. It did not appear to be stenosis and surgery is not required for his back. He was having persistent right groin and leg pain. Of note, his strength was intact on nerve testing. He has been having debilitating pain with pain at rest and difficulty walking secondary to his pain.  It was bothering him even prior to his other surgery.   ALLERGIES/ ADVERSE REACTIONS: None.   PAST MEDICAL HISTORY:  1. Diabetes.  2. Hypertension.  3. High cholesterol.  4. Cataracts.  5. Arthritis.   PHYSICAL EXAMINATION: HEART: Regular rate and rhythm. LUNGS: Clear to auscultation. RIGHT HIP: 0 degrees internal rotation, 20 degrees external rotation. Distally he does have palpable pulses. He is able to flex and extend the toes and just has some diminished sensation globally consistent with peripheral neuropathy. X-rays of  the right hip show complete loss of joint space without bone loss. There is some subchondral cyst formation in the head and acetabulum.   HOSPITAL COURSE: The patient underwent the aforementioned procedure by Dr. Rosita Kea on 03/19 without complication and was transferred to the postanesthesia care unit and then the orthopedic floor in stable condition. The first day postoperatively he was started on iron supplement. He did have some postoperative anemia secondary to acute postoperative blood loss. He was treated for deep vein thrombosis prophylaxis with Xarelto and TED hose as well as AV-I boots. He was ambulatory. He was tolerating his diet early on. Hemoglobin was 7.6 on postoperative day two, and he did undergo transfusion of 1 unit of packed red blood cells.  His right hip dressing was changed. His incision was clean and intact with staples and no sign of infection. The patient did complain of a burning pain in his leg and Lyrica had been started. Postoperative day three his hemoglobin was 8.3.  He had  progressed well with therapy and ambulated over 300 feet. He was doing well and ready for discharge. Of note, his Foley catheter had been removed in good time while he was here and he had done well with therapy.   CONDITION AT DISCHARGE: Stable.   DISPOSITION: Home with Home Health physical therapy.   DISCHARGE MEDICATIONS:  Oxycodone 5 to 10 mg every four hours as needed for pain. He would administer to himself Lovenox 40 mg subcutaneous injections once a day. He did have these already at home. After he finished the Lovenox he was to take a baby aspirin per day.  He  was prescribed Lyrica 50 mg twice a day for neuropathic pain.   DISCHARGE INSTRUCTIONS AND FOLLOWUP:  1. He is weight-bearing as tolerated on the surgical leg.  2. He will wear knee-high TED hose during the day.  3. Regular diet with no concentrated sweets or sugar.  4. He may ice his hip but he will leave his dressing on.  5. He will  call our office for any disturbing symptoms.  6. [facility] 04/03 at 9:45 a.m. for staple removal.     ____________________________ Paul MoynahanJonathan R. Damonta Hamilton, [place]PA jrp:bjt D: 05/09/2011 10:09:40 ET T: 05/09/2011 10:27:05 ET JOB#: [id number]  cc: Paul MoynahanJonathan R. Paul Hamilton, [place]PA, <Dictator> Paul MoynahanJONATHAN R Paul Macpherson PA ELECTRONICALLY SIGNED 05/17/2011 8:26
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
## 2015-05-23 DIAGNOSIS — G8929 Other chronic pain: Secondary | ICD-10-CM

## 2015-05-23 DIAGNOSIS — M545 Low back pain: Secondary | ICD-10-CM

## 2015-05-23 HISTORY — DX: Other chronic pain: G89.29

## 2016-02-14 ENCOUNTER — Other Ambulatory Visit: Payer: Self-pay

## 2016-02-14 DIAGNOSIS — E119 Type 2 diabetes mellitus without complications: Secondary | ICD-10-CM | POA: Insufficient documentation

## 2016-02-14 DIAGNOSIS — I10 Essential (primary) hypertension: Secondary | ICD-10-CM | POA: Insufficient documentation

## 2016-02-14 HISTORY — DX: Essential (primary) hypertension: I10

## 2016-02-14 HISTORY — DX: Type 2 diabetes mellitus without complications: E11.9

## 2016-02-15 ENCOUNTER — Encounter: Payer: Self-pay | Admitting: General Surgery

## 2016-02-15 ENCOUNTER — Ambulatory Visit (INDEPENDENT_AMBULATORY_CARE_PROVIDER_SITE_OTHER): Payer: Medicare HMO | Admitting: General Surgery

## 2016-02-15 DIAGNOSIS — L02215 Cutaneous abscess of perineum: Secondary | ICD-10-CM

## 2016-02-15 HISTORY — DX: Cutaneous abscess of perineum: L02.215

## 2016-02-15 NOTE — Patient Instructions (Signed)
Please put the shower head on the area with warm water.  Please place a hot/warm cloth four times a day.  We will see you tomorrow to make sure that the area continues to drain.  Please finish taking your antibiotic.

## 2016-02-15 NOTE — Progress Notes (Signed)
Patient ID: Paul Hamilton, male   DOB: 02/15/1933, 83 y.o.   MRN: [id number]  CC: Abscess to perineum  HPI Paul Hamilton is a 83 y.o. male who presents to clinic today for evaluation of an abscess of left side of his perineum behind the scrotum. Patient states she's had numerous abscess like this before but this wonders isn't getting better. He's had one abscess previously that required surgical drainage. This wasn't present for 8 days and he was started on oral antibiotics earlier this week by his primary care provider. He states that he feels like it wants to drain but has not been draining. He denies any current fevers, chills, nausea, vomiting, chest pain, shortness of breath, diarrhea, constipation.  HPI  Past medical history: Diabetes type 2, hypertension, hyperlipidemia, PVC, previous MI, chronic back pain  Past surgical history: Bilateral hip replacements  Family history: Patient denies any known family history of heart disease, diabetes, cancers.  Social History Social History  Substance Use Topics  . Smoking status: Current Every Day Smoker    Packs/day: 1.00  . Smokeless tobacco: Never Used  . Alcohol use Not on file    No Known Allergies  Current Outpatient Prescriptions  Medication Sig Dispense Refill  . doxycycline (VIBRAMYCIN) 100 MG capsule Take 1 capsule by mouth 2 (two) times daily.    Marland Kitchen. losartan (COZAAR) 50 MG tablet Take 50 mg by mouth daily.    . metFORMIN (GLUCOPHAGE-XR) 500 MG 24 hr tablet Take 500 mg by mouth daily.    . pravastatin (PRAVACHOL) 40 MG tablet Take 40 mg by mouth daily.     No current facility-administered medications for this visit.      Review of Systems A Multi-point review of systems was asked and was negative except for the findings documented in the history of present illness  Physical Exam Blood pressure 114/67, pulse 81, temperature 98 F (36.7 C), temperature source Oral, height 5\' 7"  (1.702 m), weight 67 kg (147 lb 9.6  oz). CONSTITUTIONAL: No acute distress. EYES: Pupils are equal, round, and reactive to light, Sclera are non-icteric. EARS, NOSE, MOUTH AND THROAT: The oropharynx is clear. The oral mucosa is pink and moist. Hearing is intact to voice. LYMPH NODES:  Lymph nodes in the neck are normal. RESPIRATORY:  Lungs are clear. There is normal respiratory effort, with equal breath sounds bilaterally, and without pathologic use of accessory muscles. CARDIOVASCULAR: Heart is regular without murmurs, gallops, or rubs. GI: The abdomen is soft, nontender, and nondistended. There are no palpable masses. There is no hepatosplenomegaly. There are normal bowel sounds in all quadrants. GU: Rectal deferred, however there is a 2 x 2 centimeter palpable area of induration with an eschar present on the most posterior aspect that will drain purulent fluid on palpation.   MUSCULOSKELETAL: Normal muscle strength and tone. No cyanosis or edema.   SKIN: Turgor is good and there are no pathologic skin lesions or ulcers. NEUROLOGIC: Motor and sensation is grossly normal. Cranial nerves are grossly intact. PSYCH:  Oriented to person, place and time. Affect is normal.  Data Reviewed There are no recent labs to review for this encounter I have personally reviewed the patient's imaging, laboratory findings and medical records.    Assessment    Perineal abscess    Plan    83 year old male with a perineal abscess. A gentle probing of the eschar with a hemostat forceps allowed return of purulent fluid. With the area actively draining out discussed the continuance  of the oral antibiotics prescribed by his primary care and the use of heat with showers and warm compresses to encourage drainage. Due to the size and location of this abscess discussed that I wanted him to return to clinic in 24 hours for an additional check and should the area not be markedly improved then he would likely require an incision and drainage at that time.  Patient voiced understanding and will return to clinic tomorrow.     Time spent with the patient was 30 minutes, with more than 50% of the time spent in face-to-face education, counseling and care coordination.     Ricarda Frame, MD FACS General Surgeon 02/15/2016, 2:58 PM

## 2016-02-16 ENCOUNTER — Encounter: Payer: Self-pay | Admitting: Surgery

## 2016-02-16 ENCOUNTER — Ambulatory Visit (INDEPENDENT_AMBULATORY_CARE_PROVIDER_SITE_OTHER): Payer: Medicare HMO | Admitting: Surgery

## 2016-02-16 VITALS — BP 115/66 | HR 91 | Temp 98.2°F | Ht 67.0 in | Wt 149.6 lb

## 2016-02-16 DIAGNOSIS — L02215 Cutaneous abscess of perineum: Secondary | ICD-10-CM
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
## 2016-02-16 NOTE — Patient Instructions (Signed)
We will see you back next Wednesday. See appointment information below.  Continue your antibiotics until they are complete.  Please call if you have any increased pain, redness, foul smelling drainage, or fever >100.5 prior to your next appointment. You will need to speak with a nurse.

## 2016-02-21 ENCOUNTER — Ambulatory Visit: Payer: Self-pay | Admitting: Surgery

## 2016-02-26 ENCOUNTER — Encounter: Payer: Self-pay | Admitting: Surgery

## 2016-02-26 ENCOUNTER — Ambulatory Visit (INDEPENDENT_AMBULATORY_CARE_PROVIDER_SITE_OTHER): Payer: Medicare HMO | Admitting: Surgery

## 2016-02-26 VITALS — BP 159/74 | HR 80 | Temp 97.4°F | Ht 67.0 in | Wt 151.0 lb

## 2016-02-26 DIAGNOSIS — L02215 Cutaneous abscess of perineum: Secondary | ICD-10-CM

## 2016-02-26 NOTE — Progress Notes (Signed)
02/26/2016  HPI: Patient was seen on 1/12 for a right perineal abscess which was spontaneously draining.  Forceps had been used to allow for the abscess to drain better.  No packing used.  He has since completed his antibiotic course.  He reports he still feels some soreness in the area.  Denies any further drainage or worsening pain.  Vital signs: BP (!) 159/74   Pulse 80   Temp 97.4 F (36.3 C) (Oral)   Ht 5\' 7"  (1.702 m)   Wt 68.5 kg (151 lb)   BMI 23.65 kg/m    Physical Exam: Constitutional: No acute distress Skin:  Right perineal wound is healing well, almost complete.  No further cellulitis or significant induration.  No purulent drainage or tenderness to palpation.  Assessment/Plan: 83 yo male with right perineal abscess.  --Abscess healing well without further evidence of infection.  No further procedures or antibiotics needed. --Have reassured patient that the soreness he still feels is normal from the inflammatory response to the abscess.  The hardened area that he feels is the inflammation that will continue to subside. --Patient understands to call office if any worsening pain, redness, swelling, or drainage.  Otherwise he may follow up on an as needed basis.   Howie IllJose Luis Aasir Daigler, MD [facility]

## 2016-02-26 NOTE — Patient Instructions (Signed)
Please call our office if you have any questions or concerns.  

## 2016-03-19 ENCOUNTER — Ambulatory Visit
Admission: RE | Admit: 2016-03-19 | Discharge: 2016-03-19 | Disposition: A | Discharge status: Home / Self Care | Payer: Medicare HMO | Source: Ambulatory Visit | Attending: Medical Oncology | Admitting: Medical Oncology

## 2016-03-19 ENCOUNTER — Other Ambulatory Visit: Payer: Self-pay | Admitting: Medical Oncology

## 2016-03-19 DIAGNOSIS — M25469 Effusion, unspecified knee: Secondary | ICD-10-CM

## 2016-03-19 DIAGNOSIS — M25561 Pain in right knee: Secondary | ICD-10-CM | POA: Diagnosis present

## 2016-03-19 DIAGNOSIS — M25461 Effusion, right knee: Secondary | ICD-10-CM | POA: Insufficient documentation

## 2017-03-05 IMAGING — US US EXTREM LOW VENOUS*R*
1 series · 13 of 24 positions shown · non-contrast
Comparison: None.

CLINICAL DATA: Right leg pain chronically but worse over the last 3
days



[Series 1: us extrem low venous*right* · 0.07mm/px · 13 of 34 slices shown]
[im 1/34]
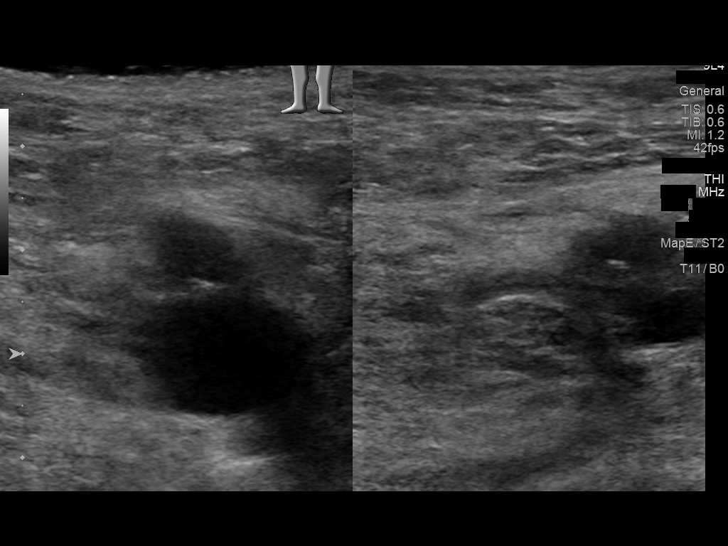
[im 3/34]
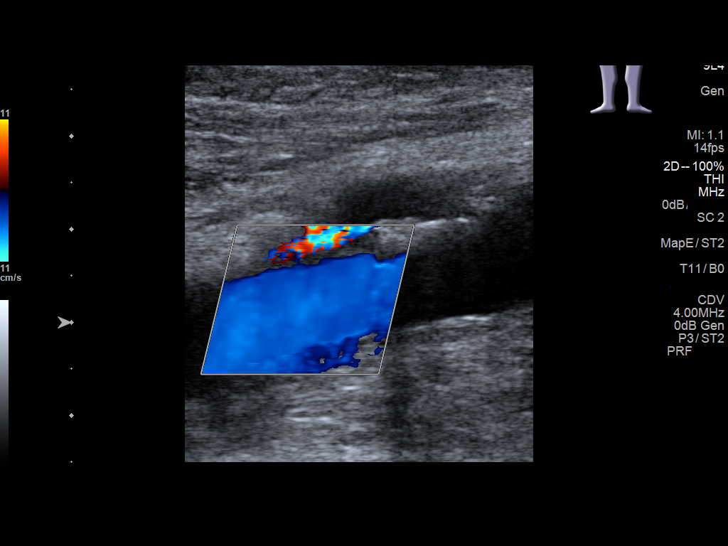
[im 6/34]
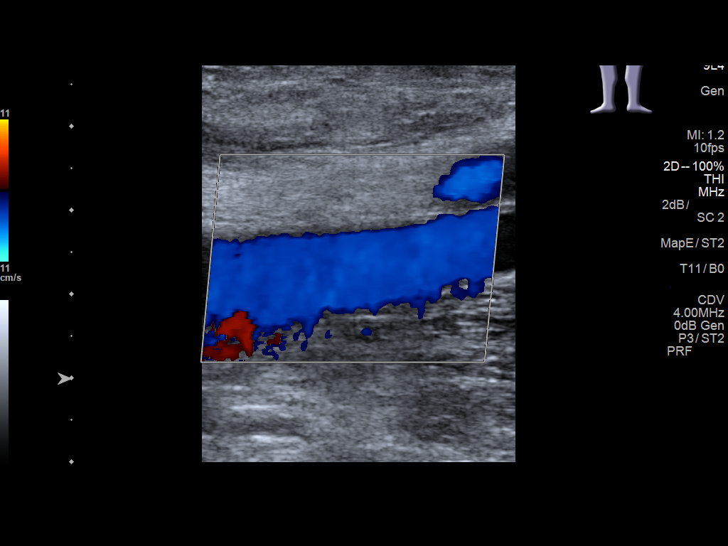
[im 9/34]
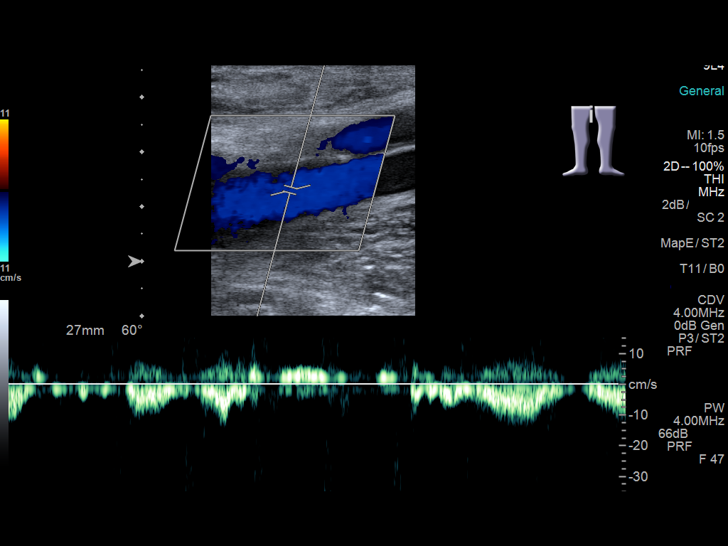
[im 12/34]
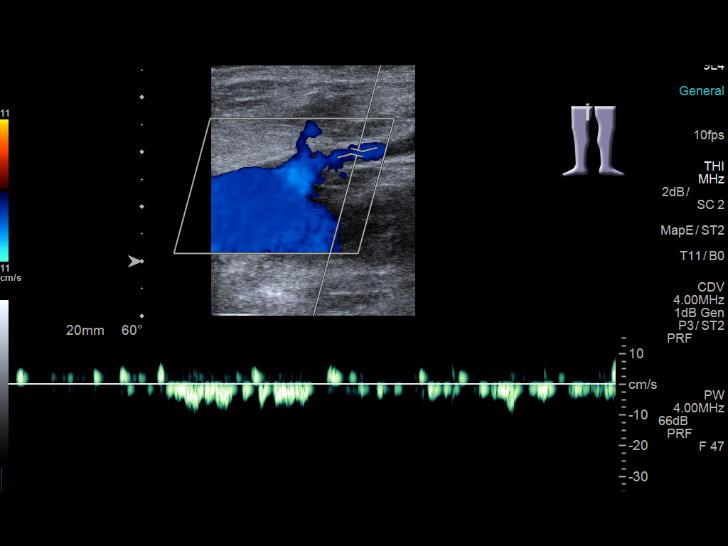
[im 15/34]
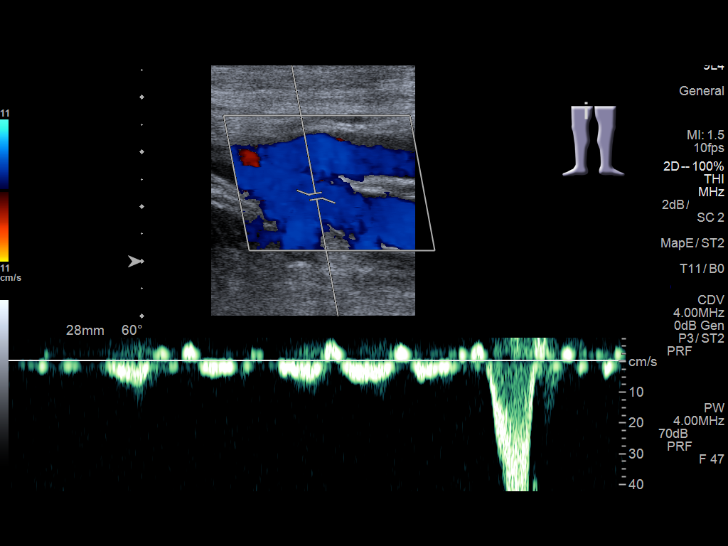
[im 18/34]
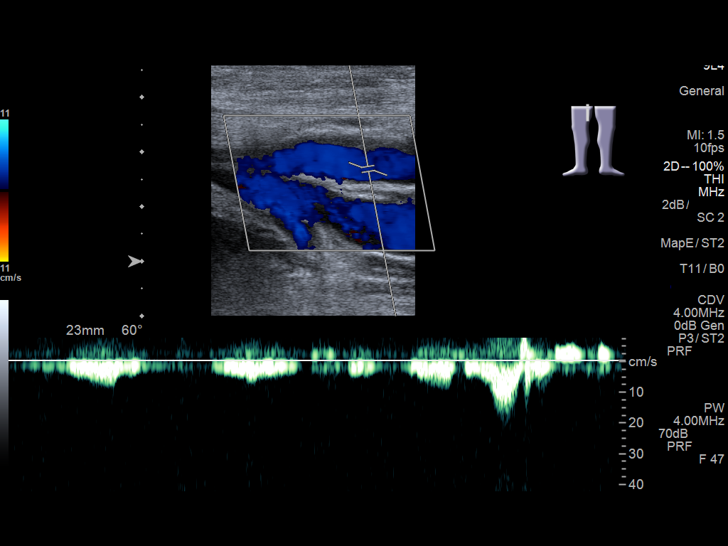
[im 19/34]
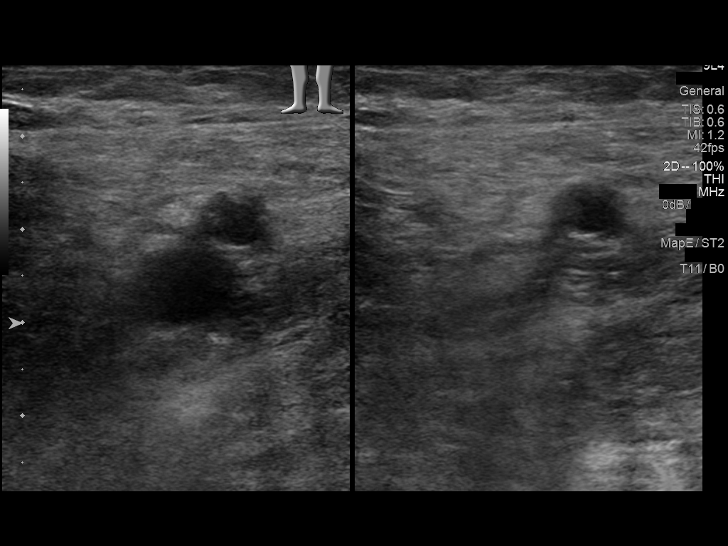
[im 22/34]
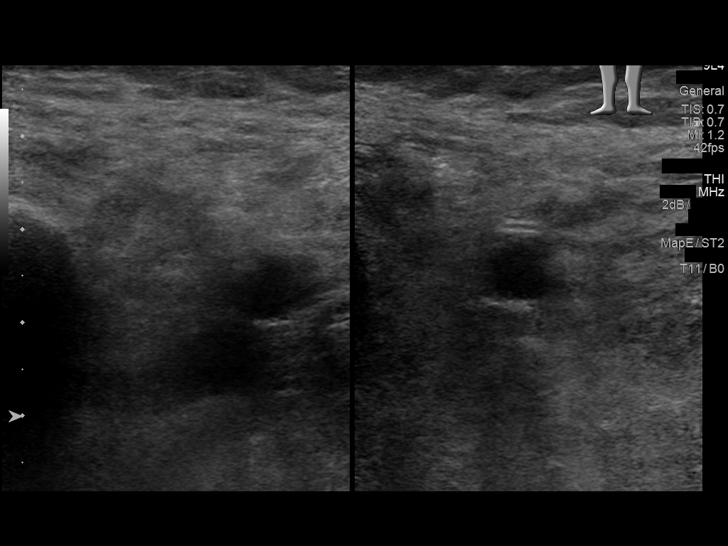
[im 25/34]
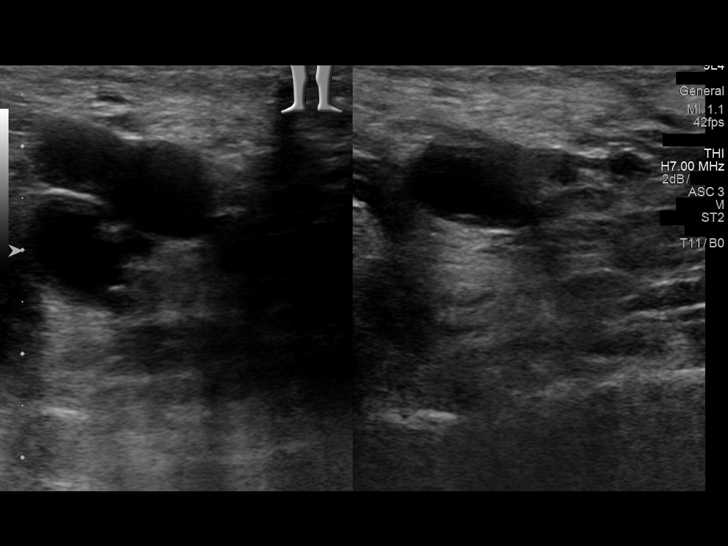
[im 28/34]
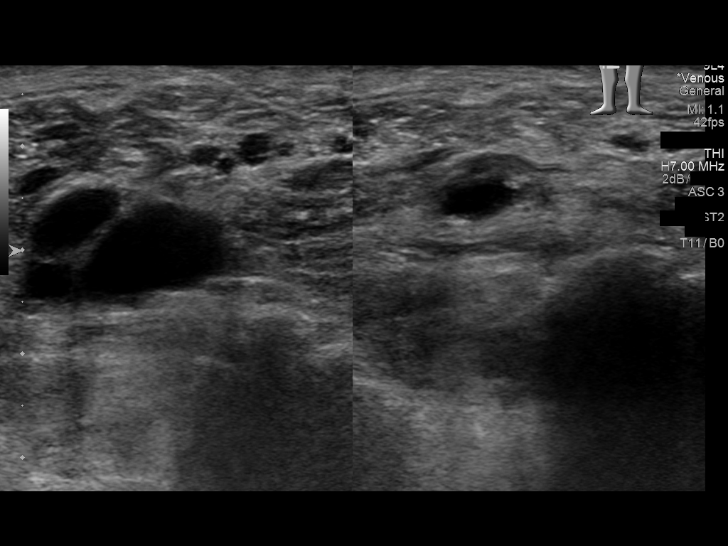
[im 31/34]
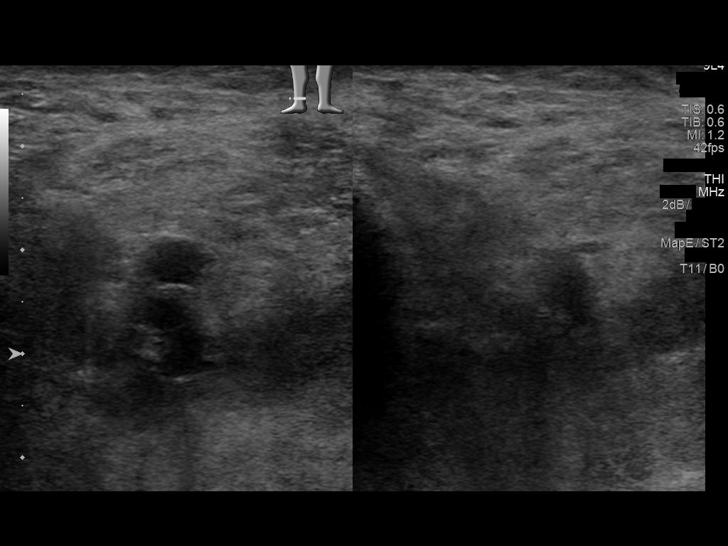
[im 34/34]
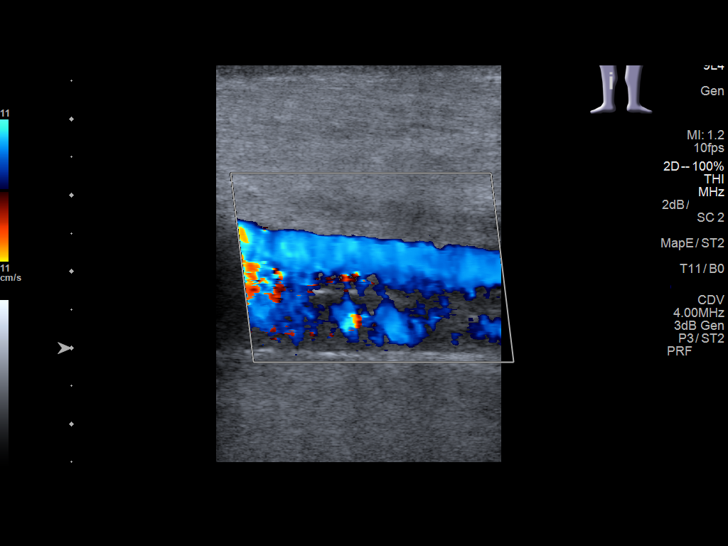

[13 of 24 positions shown; findings below may reference images not displayed]

FINDINGS: Contralateral Common Femoral Vein: Respiratory phasicity is normal
and symmetric with the symptomatic side. No evidence of thrombus.
Normal compressibility.

Common Femoral Vein: No evidence of thrombus. Normal
compressibility, respiratory phasicity and response to augmentation.

Saphenofemoral Junction: No evidence of thrombus. Normal
compressibility and flow on color Doppler imaging.

Profunda Femoral Vein: No evidence of thrombus. Normal
compressibility and flow on color Doppler imaging.

Femoral Vein: No evidence of thrombus. Normal compressibility,
respiratory phasicity and response to augmentation.

Popliteal Vein: No evidence of thrombus. Normal compressibility,
respiratory phasicity and response to augmentation.

Calf Veins: No evidence of thrombus. Normal compressibility and flow
on color Doppler imaging.

Superficial Great Saphenous Vein: No evidence of thrombus. Normal
compressibility and flow on color Doppler imaging.

Venous Reflux:  None.

Other Findings:  None.
IMPRESSION: No evidence of deep venous thrombosis.

## 2017-08-19 ENCOUNTER — Encounter: Payer: Self-pay | Admitting: Internal Medicine

## 2017-08-19 ENCOUNTER — Inpatient Hospital Stay: Payer: Medicare HMO | Attending: Internal Medicine | Admitting: Internal Medicine

## 2017-08-19 DIAGNOSIS — D729 Disorder of white blood cells, unspecified: Secondary | ICD-10-CM | POA: Insufficient documentation

## 2017-08-19 NOTE — Assessment & Plan Note (Addendum)
#   Neutrophilia/mild monocytosis-long-standing/chronic since 2015 [12-14]; with mild anemia hemoglobin on 12.5.  The differential diagnosis includes-primary bone marrow process like chronic myelo proliferative neoplasm [CMML] versus reactive [smoking versus others].   #Given the chronicity of the above problem-I suspect reactive; rather than any primary myeloproliferative neoplasm.   # smoking-discussed  smoking cessation patient states he is "trying to quit"  # Lung cancer screening- pt declined  # Follow-up in 6 months labs CBC/cmp/ldh;  Thank you Ms.Elizabeth White NP for allowing me to participate in the care of your pleasant patient. Please do not hesitate to contact me with questions or concerns in the interim.  # 45 minutes face-to-face with the patient discussing the above plan of care; more than 50% of time spent on prognosis/ natural history; counseling and coordination.  Cc; Doristine MangoElizabeth White.

## 2017-08-19 NOTE — Progress Notes (Signed)
[facility] CONSULT NOTE  Patient Care Team: Titus MouldWhite, Elizabeth Burney, NP as PCP - General (Family Medicine)  CHIEF COMPLAINTS/PURPOSE OF CONSULTATION:  LEUCOCYTOSIS  # 2015- MILD LEUCOCYTOSIS [mild neutrophilia/monocytosis] ~12-14- s/p eval by Dr.Ramiah ? Smoking vs other  # active smoker/ COPD  No history exists.     HISTORY OF PRESENTING ILLNESS:  Paul Hamilton 84 y.o.  male history of chronic smoking ; chronic leukocytosis has been referred to us for further evaluation recommendations.  Patient denies any weight loss but denies any fevers or night sweats.   Patient has chronic shortness of breath which he attributes to his COPD.   Patient states in the past [in 2015] he was evaluated at the cancer center similar problem.  He never had any bone marrow biopsies.  He continues to be asymptomatic otherwise.  Review of Systems  Constitutional: Negative for chills, diaphoresis, fever, malaise/fatigue and weight loss.  HENT: Negative for nosebleeds and sore throat.   Eyes: Negative for double vision.  Respiratory: Positive for cough (Chronic) and shortness of breath (Chronic). Negative for hemoptysis, sputum production and wheezing.   Cardiovascular: Negative for chest pain, palpitations, orthopnea and leg swelling.  Gastrointestinal: Negative for abdominal pain, blood in stool, constipation, diarrhea, heartburn, melena, nausea and vomiting.  Genitourinary: Negative for dysuria, frequency and urgency.  Musculoskeletal: Negative for back pain and joint pain.  Skin: Negative.  Negative for itching and rash.  Neurological: Negative for dizziness, tingling, focal weakness, weakness and headaches.  Endo/Heme/Allergies: Does not bruise/bleed easily.  Psychiatric/Behavioral: Negative for depression. The patient is not nervous/anxious and does not have insomnia.      MEDICAL HISTORY:  Past Medical History:  Diagnosis Date  . Abscess, perineum 02/15/2016  . Benign  prostatic hyperplasia without lower urinary tract symptoms 08/12/2011  . Chronic low back pain 05/23/2015   Overview:  Not a surgical candidate. Scheduled narcotics by provider. Pain contract has been signed.   . Diabetes mellitus type 2, uncomplicated (HCC) 02/14/2016   Overview:  metformin  . ED (erectile dysfunction) 08/30/2013  . Hypercholesterolemia 07/08/2011  . Hypertension 02/14/2016   Overview:  losartan  . Inferior MI (HCC) 04/30/2013  . Osteoarthrosis 08/12/2011  . Patellar tendon rupture 12/10/2011   This appears to be a partial patellar tendon tear or internal disruption   . PVC (premature ventricular contraction) 03/26/2013  . Tobacco abuse 03/26/2013    SURGICAL HISTORY: Past Surgical History:  Procedure Laterality Date  . JOINT REPLACEMENT     Hip    SOCIAL HISTORY: Social History   Socioeconomic History  . Marital status: Married    Spouse name: Not on file  . Number of children: Not on file  . Years of education: Not on file  . Highest education level: Not on file  Occupational History  . Not on file  Social Needs  . Financial resource strain: Not on file  . Food insecurity:    Worry: Not on file    Inability: Not on file  . Transportation needs:    Medical: Not on file    Non-medical: Not on file  Tobacco Use  . Smoking status: Current Every Day Smoker    Packs/day: 1.00  . Smokeless tobacco: Never Used  Substance and Sexual Activity  . Alcohol use: No  . Drug use: No  . Sexual activity: Not on file  Lifestyle  . Physical activity:    Days per week: Not on file    Minutes per session: Not  on file  . Stress: Not on file  Relationships  . Social connections:    Talks on phone: Not on file    Gets together: Not on file    Attends religious service: Not on file    Active member of club or organization: Not on file    Attends meetings of clubs or organizations: Not on file    Relationship status: Not on file  . Intimate partner violence:    Fear of  current or ex partner: Not on file    Emotionally abused: Not on file    Physically abused: Not on file    Forced sexual activity: Not on file  Other Topics Concern  . Not on file  Social History Narrative   Smoking 1ppd [all my life]; no alcohol, in [place]; with wife; auto-body works; retd.     FAMILY HISTORY: Family History  Problem Relation Age of Onset  . Hypertension Father     ALLERGIES:  has No Known Allergies.  MEDICATIONS:  Current Outpatient Medications  Medication Sig Dispense Refill  . hydrochlorothiazide (HYDRODIURIL) 12.5 MG tablet Take 1 tablet by mouth daily.    . metFORMIN (GLUCOPHAGE) 1000 MG tablet Take 1 tablet by mouth daily.    . pravastatin (PRAVACHOL) 40 MG tablet Take 40 mg by mouth daily.    . traMADol (ULTRAM) 50 MG tablet Take 1 tablet by mouth every 6 (six) hours as needed for pain.    Marland Kitchen Umeclidinium-Vilanterol (ANORO ELLIPTA IN) Inhale 1 tablet into the lungs 2 (two) times daily as needed for shortness of breath.    . losartan (COZAAR) 25 MG tablet Take 1 tablet by mouth daily.     No current facility-administered medications for this visit.       Marland Kitchen  PHYSICAL EXAMINATION: ECOG PERFORMANCE STATUS: 0 - Asymptomatic  Vitals:   08/19/17 1050  BP: 116/77  Pulse: 70  Temp: 97.9 F (36.6 C)   Filed Weights   08/19/17 1050  Weight: 144 lb 6.4 oz (65.5 kg)    GENERAL: Well-nourished well-developed; Alert, no distress and comfortable.  Accompanied by family.  EYES: no pallor or icterus OROPHARYNX: no thrush or ulceration; NECK: supple; no lymph nodes felt. LYMPH:  no palpable lymphadenopathy in the axillary or inguinal regions LUNGS: Decreased breath sounds auscultation bilaterally. No wheeze or crackles HEART/CVS: regular rate & rhythm and no murmurs; No lower extremity edema ABDOMEN:abdomen soft, non-tender and normal bowel sounds. No hepatomegaly or splenomegaly.  Musculoskeletal:no cyanosis of digits and no clubbing  PSYCH: alert &  oriented x 3 with fluent speech NEURO: no focal motor/sensory deficits SKIN:  no rashes or significant lesions  LABORATORY DATA:  I have reviewed the data as listed Lab Results  Component Value Date   WBC 12.3 (H) 06/07/2013   HGB 12.9 (L) 06/07/2013   HCT 40.7 06/07/2013   MCV 82 06/07/2013   PLT 167 06/07/2013   No results for input(s): NA, K, CL, CO2, GLUCOSE, BUN, CREATININE, CALCIUM, GFRNONAA, GFRAA, PROT, ALBUMIN, AST, ALT, ALKPHOS, BILITOT, BILIDIR, IBILI in the last 8760 hours.  RADIOGRAPHIC STUDIES: I have personally reviewed the radiological images as listed and agreed with the findings in the report. No results found.  ASSESSMENT & PLAN:   Neutrophilia # Neutrophilia/mild monocytosis-long-standing/chronic since 2015 [12-14]; with mild anemia hemoglobin on 12.5.  The differential diagnosis includes-primary bone marrow process like chronic myelo proliferative neoplasm [CMML] versus reactive [smoking versus others].   #Given the chronicity of the above problem-I suspect reactive;  rather than any primary myeloproliferative neoplasm.   # smoking-discussed  smoking cessation patient states he is "trying to quit"  # Lung cancer screening- pt declined  # Follow-up in 6 months labs CBC/cmp/ldh;  Thank you Ms.Elizabeth White NP for allowing me to participate in the care of your pleasant patient. Please do not hesitate to contact me with questions or concerns in the interim.  # 45 minutes face-to-face with the patient discussing the above plan of care; more than 50% of time spent on prognosis/ natural history; counseling and coordination.  Cc; Doristine Mango.     All questions were answered. The patient knows to call the clinic with any problems, questions or concerns.       Earna Coder, MD 08/19/2017 6:15 PM

## 2017-09-18 ENCOUNTER — Encounter: Payer: Self-pay | Admitting: Family Medicine

## 2018-09-08 ENCOUNTER — Ambulatory Visit: Payer: Medicare HMO | Admitting: Hematology and Oncology

## 2020-07-05 DEATH — deceased
